# Patient Record
Sex: Male | Born: 2000 | Race: Black or African American | Hispanic: No | Marital: Single
Health system: Southern US, Community
[De-identification: ages and names within clinical notes are randomized; demographics above are authoritative.]

## PROBLEM LIST (undated history)

## (undated) HISTORY — PX: NO PAST SURGERIES: SHX2092

---
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
## 2019-05-09 ENCOUNTER — Other Ambulatory Visit: Payer: Self-pay

## 2019-05-09 ENCOUNTER — Ambulatory Visit: Payer: Self-pay | Attending: Internal Medicine

## 2019-05-09 DIAGNOSIS — Z20822 Contact with and (suspected) exposure to covid-19: Secondary | ICD-10-CM

## 2019-05-10 LAB — NOVEL CORONAVIRUS, NAA: SARS-CoV-2, NAA: NOT DETECTED

## 2020-03-06 ENCOUNTER — Encounter (INDEPENDENT_AMBULATORY_CARE_PROVIDER_SITE_OTHER): Payer: Self-pay | Admitting: Student in an Organized Health Care Education/Training Program

## 2021-09-05 ENCOUNTER — Other Ambulatory Visit: Payer: Self-pay

## 2021-09-05 ENCOUNTER — Emergency Department (HOSPITAL_COMMUNITY)
Admission: EM | Admit: 2021-09-05 | Discharge: 2021-09-05 | Disposition: A | Discharge status: Home / Self Care | Payer: Self-pay | Attending: Emergency Medicine | Admitting: Emergency Medicine

## 2021-09-05 ENCOUNTER — Encounter (HOSPITAL_COMMUNITY): Payer: Self-pay

## 2021-09-05 DIAGNOSIS — Z23 Encounter for immunization: Secondary | ICD-10-CM | POA: Insufficient documentation

## 2021-09-05 DIAGNOSIS — S71111A Laceration without foreign body, right thigh, initial encounter: Secondary | ICD-10-CM | POA: Insufficient documentation

## 2021-09-05 DIAGNOSIS — J029 Acute pharyngitis, unspecified: Secondary | ICD-10-CM | POA: Insufficient documentation

## 2021-09-05 DIAGNOSIS — Z20822 Contact with and (suspected) exposure to covid-19: Secondary | ICD-10-CM | POA: Insufficient documentation

## 2021-09-05 DIAGNOSIS — W268XXA Contact with other sharp object(s), not elsewhere classified, initial encounter: Secondary | ICD-10-CM | POA: Insufficient documentation

## 2021-09-05 DIAGNOSIS — S81811A Laceration without foreign body, right lower leg, initial encounter: Secondary | ICD-10-CM

## 2021-09-05 LAB — RESP PANEL BY RT-PCR (FLU A&B, COVID) ARPGX2
Influenza A by PCR: NEGATIVE
Influenza B by PCR: NEGATIVE
SARS Coronavirus 2 by RT PCR: NEGATIVE

## 2021-09-05 LAB — GROUP A STREP BY PCR: Group A Strep by PCR: NOT DETECTED

## 2021-09-05 MED ORDER — TETANUS-DIPHTH-ACELL PERTUSSIS 5-2.5-18.5 LF-MCG/0.5 IM SUSY
0.5000 mL | PREFILLED_SYRINGE | Freq: Once | INTRAMUSCULAR | Status: AC
  Administered 2021-09-05: 0.5 mL via INTRAMUSCULAR
  Filled 2021-09-05: qty 0.5

## 2021-09-05 MED ORDER — LIDOCAINE-EPINEPHRINE (PF) 2 %-1:200000 IJ SOLN
10.0000 mL | Freq: Once | INTRAMUSCULAR | Status: AC
  Administered 2021-09-05: 10 mL
  Filled 2021-09-05: qty 20

## 2021-09-05 NOTE — ED Notes (Signed)
An After Visit Summary was printed and given to the patient. Discharge instructions given and no further questions at this time.  
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
## 2021-09-05 NOTE — Discharge Instructions (Addendum)
You were seen today for a laceration. The sutures should be removed in 8-10 days. Please keep the area clean.  I recommend using over the counter medication for your sore throat. Return to work once you are fever free for 24 hours
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
## 2022-02-06 ENCOUNTER — Other Ambulatory Visit: Payer: Self-pay

## 2022-02-06 ENCOUNTER — Encounter (HOSPITAL_COMMUNITY): Payer: Self-pay | Admitting: Emergency Medicine

## 2022-02-06 ENCOUNTER — Ambulatory Visit
Admission: RE | Admit: 2022-02-06 | Discharge: 2022-02-06 | Disposition: A | Discharge status: Home / Self Care | Payer: Commercial Managed Care - HMO | Source: Ambulatory Visit | Attending: Emergency Medicine | Admitting: Emergency Medicine

## 2022-02-06 ENCOUNTER — Emergency Department (HOSPITAL_COMMUNITY)
Admission: EM | Admit: 2022-02-06 | Discharge: 2022-02-06 | Disposition: A | Discharge status: Home / Self Care | Payer: Commercial Managed Care - HMO | Attending: Emergency Medicine | Admitting: Emergency Medicine

## 2022-02-06 DIAGNOSIS — J069 Acute upper respiratory infection, unspecified: Secondary | ICD-10-CM | POA: Insufficient documentation

## 2022-02-06 DIAGNOSIS — Z20822 Contact with and (suspected) exposure to covid-19: Secondary | ICD-10-CM | POA: Insufficient documentation

## 2022-02-06 DIAGNOSIS — B349 Viral infection, unspecified: Secondary | ICD-10-CM

## 2022-02-06 DIAGNOSIS — J029 Acute pharyngitis, unspecified: Secondary | ICD-10-CM | POA: Diagnosis present

## 2022-02-06 LAB — RESP PANEL BY RT-PCR (FLU A&B, COVID) ARPGX2
Influenza A by PCR: NEGATIVE
Influenza B by PCR: NEGATIVE
SARS Coronavirus 2 by RT PCR: NEGATIVE

## 2022-02-06 LAB — GROUP A STREP BY PCR: Group A Strep by PCR: NOT DETECTED

## 2022-02-06 NOTE — Discharge Instructions (Addendum)
Your COVID, flu and strep tests are all negative today.  Good medications for cold and flu symptoms include DayQuil and NyQuil.  These are similar medications to Tylenoll Cold and Flu.  Either one is fine.  Ibuprofen is very good for body aches and chills as well.  It is safe to alternate this with the above medications.  You may try anything else over-the-counter that helps you.  Ultimately, be sure to stay hydrated and return to the emergency department with any worsening symptoms.  It was a pleasure to meet you and we hope you feel better.  Your work note is attached!

## 2022-02-06 NOTE — ED Triage Notes (Signed)
Pt c/o sore throat, congestion, cough, and headache x 2 days.

## 2022-02-06 NOTE — ED Provider Notes (Signed)
[facility]-EMERGENCY DEPT Provider Note   CSN: [id number] Arrival date & time: 02/06/22  2118     History  Chief Complaint  Patient presents with   Sore Throat    Craig Costa is a 21 y.o. male presenting with 2 days worth of URI symptoms.  Reports he started out with nasal congestion however after taking Mucinex he has profuse rhinorrhea.  Also complaining of headache, sore throat cough.  Believes someone at work is sick.   Sore Throat       Home Medications Prior to Admission medications   Medication Sig Start Date End Date Taking? Authorizing Provider  amitriptyline (ELAVIL) 25 MG tablet Take 1.5 tablets (37.5 mg total) by mouth at bedtime. (Start with 1 tablet nightly for the first week) 08/05/17   Keturah Shavers, MD  dicyclomine (BENTYL) 20 MG tablet Take 1 tablet (20 mg total) by mouth 2 (two) times daily. Patient not taking: Reported on 08/05/2017 01/05/17   Rolan Bucco, MD  hyoscyamine (LEVSIN, ANASPAZ) 0.125 MG tablet Take 1 tablet (0.125 mg total) by mouth every 4 (four) hours as needed. Patient not taking: Reported on 08/05/2017 01/19/17   Adelene Amas, MD  lansoprazole (PREVACID) 30 MG capsule Take 30 mg by mouth daily at 12 noon.    [provider]  magnesium oxide (MAG-OX) 400 MG tablet Take 400 mg by mouth daily.    [provider]  RaNITidine HCl (ZANTAC PO) Take by mouth.    [provider]  Riboflavin 100 MG TABS Take by mouth.    [provider]      Allergies    Amoxicillin    Review of Systems   Review of Systems  Physical Exam Updated Vital Signs BP (!) 163/84 (BP Location: Left Arm)   Pulse 68   Temp 98.2 F (36.8 C) (Oral)   Resp 16   Ht 5\' 11"  (1.803 m)   Wt 63.5 kg   SpO2 100%   BMI 19.53 kg/m  Physical Exam Vitals and nursing note reviewed.  Constitutional:      General: He is not in acute distress.    Appearance: Normal appearance. He is not ill-appearing.  HENT:      Head: Normocephalic and atraumatic.     Mouth/Throat:     Mouth: Mucous membranes are moist.     Pharynx: Oropharynx is clear. Posterior oropharyngeal erythema present. No oropharyngeal exudate.     Tonsils: No tonsillar exudate.  Eyes:     General: No scleral icterus.    Conjunctiva/sclera: Conjunctivae normal.  Pulmonary:     Effort: Pulmonary effort is normal. No respiratory distress.  Skin:    Findings: No rash.  Neurological:     Mental Status: He is alert.  Psychiatric:        Mood and Affect: Mood normal.     ED Results / Procedures / Treatments   Labs (all labs ordered are listed, but only abnormal results are displayed) Labs Reviewed  RESP PANEL BY RT-PCR (FLU A&B, COVID) ARPGX2  GROUP A STREP BY PCR    EKG None  Radiology No results found.  Procedures Procedures   Medications Ordered in ED Medications - No data to display  ED Course/ Medical Decision Making/ A&P                           Medical Decision Making  21 year old male presenting with viral symptoms.  Have been going  on for the past couple days.  Questionable sick contact at work.  Work-up today negative.  No strep, COVID, flu.  Well-appearing and has not tried any other over-the-counter medications outside of Mucinex.  He was given a list of appropriate medications to try and a work note.  He is agreeable to discharge at this time.  Does not appear to have an emergent condition requiring further imaging or intervention.  Ambulated out of the department.   Final Clinical Impression(s) / ED Diagnoses Final diagnoses:  Viral URI with cough    Rx / DC Orders ED Discharge Orders     None      Results and diagnoses were explained to the patient. Return precautions discussed in full. Patient had no additional questions and expressed complete understanding.   This chart was dictated using voice recognition software.  Despite best efforts to proofread,  errors can occur which can change the  documentation meaning.     Woodroe Chen 02/06/22 2323    Lorre Nick, MD 02/10/22 1431

## 2022-02-06 NOTE — ED Notes (Signed)
Attempt to call patient from lobby, no response.  

## 2022-02-06 NOTE — ED Notes (Signed)
Attempt to call from lobby, no response. Patient called and states he is on the way in.

## 2022-02-06 NOTE — ED Notes (Signed)
Attempt to call from lobby, patient in restroom.

## 2022-02-07 ENCOUNTER — Ambulatory Visit (HOSPITAL_COMMUNITY): Payer: Self-pay

## 2022-03-25 ENCOUNTER — Ambulatory Visit
Admission: EM | Admit: 2022-03-25 | Discharge: 2022-03-25 | Disposition: A | Discharge status: Home / Self Care | Payer: BC Managed Care – PPO | Attending: Emergency Medicine | Admitting: Emergency Medicine

## 2022-03-25 DIAGNOSIS — Z20822 Contact with and (suspected) exposure to covid-19: Secondary | ICD-10-CM | POA: Insufficient documentation

## 2022-03-25 DIAGNOSIS — J309 Allergic rhinitis, unspecified: Secondary | ICD-10-CM | POA: Insufficient documentation

## 2022-03-25 NOTE — ED Provider Notes (Signed)
[facility]    CSN: [id number] Arrival date & time: 03/25/22  1118    HISTORY   Chief Complaint  Patient presents with   Cough   HPI Craig Costa is a pleasant, 21 y.o. male who presents to urgent care today. Pt complains of a 4-day history of clear runny nose, nasal congestion, nonproductive cough.  Patient states his employer sent him home early today because of his coughing, states he needs a note to return to work. Pt tried Benadryl with no relief.  Patient reports a history of allergy to pollen, not currently taking allergy medication at this time.  Patient states the cough sometimes comes out of nowhere and once he starts coughing it is hard to stop.  Patient states he has had a runny nose but has also noticed that he has a lot of postnasal drip as well.  Patient states sometimes his cough wakes him up at night.  Patient denies fever, body aches, chills, sore throat, loss of taste or smell, nausea, vomiting, diarrhea.  Patient states he has been exposed to several coworkers with COVID-19.  The history is provided by the patient.   History reviewed. No pertinent past medical history. Patient Active Problem List   Diagnosis Date Noted   RHINITIS, ALLERGIC 05/28/2006   ECZEMA, ATOPIC DERMATITIS 05/28/2006   Past Surgical History:  Procedure Laterality Date   NO PAST SURGERIES      Home Medications    Prior to Admission medications   Medication Sig Start Date End Date Taking? Authorizing Provider  amitriptyline (ELAVIL) 25 MG tablet Take 1.5 tablets (37.5 mg total) by mouth at bedtime. (Start with 1 tablet nightly for the first week) 08/05/17   Keturah Shavers, MD  lansoprazole (PREVACID) 30 MG capsule Take 30 mg by mouth daily at 12 noon.    [provider]  magnesium oxide (MAG-OX) 400 MG tablet Take 400 mg by mouth daily.    [provider]  RaNITidine HCl (ZANTAC PO) Take by mouth.    [provider]  Riboflavin 100 MG TABS Take by  mouth.    [provider]    Family History Family History  Problem Relation Age of Onset   Crohn's disease Mother    Crohn's disease Paternal Grandfather    Seizures Sister    Autism Brother    ADD / ADHD Brother    Migraines Neg Hx    Anxiety disorder Neg Hx    Depression Neg Hx    Bipolar disorder Neg Hx    Schizophrenia Neg Hx    Social History Social History   Tobacco Use   Smoking status: Passive Smoke Exposure - Never Smoker   Smokeless tobacco: Never  Substance Use Topics   Alcohol use: No   Drug use: No   Allergies   Amoxicillin  Review of Systems Review of Systems Pertinent findings revealed after performing a 14 point review of systems has been noted in the history of present illness.  Physical Exam Triage Vital Signs ED Triage Vitals  Enc Vitals Group     BP 01/25/21 0827 (!) 147/82     Pulse Rate 01/25/21 0827 72     Resp 01/25/21 0827 18     Temp 01/25/21 0827 98.3 F (36.8 C)     Temp Source 01/25/21 0827 Oral     SpO2 01/25/21 0827 98 %     Weight --      Height --  Head Circumference --      Peak Flow --      Pain Score 01/25/21 0826 5     Pain Loc --      Pain Edu? --      Excl. in GC? --   No data found.  Updated Vital Signs BP 127/73 (BP Location: Left Arm)   Pulse 69   Temp 98.5 F (36.9 C) (Oral)   Resp 18   SpO2 98%   Physical Exam Vitals and nursing note reviewed.  Constitutional:      General: He is not in acute distress.    Appearance: Normal appearance. He is not ill-appearing.  HENT:     Head: Normocephalic and atraumatic.     Salivary Glands: Right salivary gland is not diffusely enlarged or tender. Left salivary gland is not diffusely enlarged or tender.     Right Ear: Ear canal and external ear normal. No drainage. A middle ear effusion is present. There is no impacted cerumen. Tympanic membrane is bulging. Tympanic membrane is not injected or erythematous.     Left Ear: Ear canal and external ear  normal. No drainage. A middle ear effusion is present. There is no impacted cerumen. Tympanic membrane is bulging. Tympanic membrane is not injected or erythematous.     Ears:     Comments: Bilateral EACs normal, both TMs bulging with clear fluid    Nose: Rhinorrhea present. No nasal deformity, septal deviation, signs of injury, nasal tenderness, mucosal edema or congestion. Rhinorrhea is clear.     Right Nostril: Occlusion present. No foreign body, epistaxis or septal hematoma.     Left Nostril: Occlusion present. No foreign body, epistaxis or septal hematoma.     Right Turbinates: Enlarged, swollen and pale.     Left Turbinates: Enlarged, swollen and pale.     Right Sinus: No maxillary sinus tenderness or frontal sinus tenderness.     Left Sinus: No maxillary sinus tenderness or frontal sinus tenderness.     Mouth/Throat:     Lips: Pink. No lesions.     Mouth: Mucous membranes are moist. No oral lesions.     Pharynx: Oropharynx is clear. Uvula midline. No posterior oropharyngeal erythema or uvula swelling.     Tonsils: No tonsillar exudate. 0 on the right. 0 on the left.     Comments: Postnasal drip Eyes:     General: Lids are normal.        Right eye: No discharge.        Left eye: No discharge.     Extraocular Movements: Extraocular movements intact.     Conjunctiva/sclera: Conjunctivae normal.     Right eye: Right conjunctiva is not injected.     Left eye: Left conjunctiva is not injected.  Neck:     Trachea: Trachea and phonation normal.  Cardiovascular:     Rate and Rhythm: Normal rate and regular rhythm.     Pulses: Normal pulses.     Heart sounds: Normal heart sounds. No murmur heard.    No friction rub. No gallop.  Pulmonary:     Effort: Pulmonary effort is normal. No accessory muscle usage, prolonged expiration or respiratory distress.     Breath sounds: Normal breath sounds. No stridor, decreased air movement or transmitted upper airway sounds. No decreased breath sounds,  wheezing, rhonchi or rales.  Chest:     Chest wall: No tenderness.  Musculoskeletal:        General: Normal range of motion.  Cervical back: Normal range of motion and neck supple. Normal range of motion.  Lymphadenopathy:     Cervical: No cervical adenopathy.  Skin:    General: Skin is warm and dry.     Findings: No erythema or rash.  Neurological:     General: No focal deficit present.     Mental Status: He is alert and oriented to person, place, and time.  Psychiatric:        Mood and Affect: Mood normal.        Behavior: Behavior normal.     Visual Acuity Right Eye Distance:   Left Eye Distance:   Bilateral Distance:    Right Eye Near:   Left Eye Near:    Bilateral Near:     UC Couse / Diagnostics / Procedures:     Radiology No results found.  Procedures Procedures (including critical care time) EKG  Pending results:  Labs Reviewed  SARS CORONAVIRUS 2 (TAT 6-24 HRS)    Medications Ordered in UC: Medications - No data to display  UC Diagnoses / Final Clinical Impressions(s)   I have reviewed the triage vital signs and the nursing notes.  Pertinent labs & imaging results that were available during my care of the patient were reviewed by me and considered in my medical decision making (see chart for details).    Final diagnoses:  Exposure to COVID-19 virus  Allergic rhinitis, unspecified seasonality, unspecified trigger   COVID-19 test pending.  If patient has a positive COVID-19 test, recommend 5-day course of dexamethasone 6 mg twice daily. Patient provided with prescriptions for Zyrtec and Flonase which were sent after discharge.  Conservative care recommended.  Return precautions advised.  Note provided for work. Please see discharge instructions below for further details of plan of care as provided to patient. ED Prescriptions   None    PDMP not reviewed this encounter.  Disposition Upon Discharge:  Condition: stable for discharge home Home:  take medications as prescribed; routine discharge instructions as discussed; follow up as advised.  Patient presented with an acute illness with associated systemic symptoms and significant discomfort requiring urgent management. In my opinion, this is a condition that a prudent lay person (someone who possesses an average knowledge of health and medicine) may potentially expect to result in complications if not addressed urgently such as respiratory distress, impairment of bodily function or dysfunction of bodily organs.   Routine symptom specific, illness specific and/or disease specific instructions were discussed with the patient and/or caregiver at length.   As such, the patient has been evaluated and assessed, work-up was performed and treatment was provided in alignment with urgent care protocols and evidence based medicine.  Patient/parent/caregiver has been advised that the patient may require follow up for further testing and treatment if the symptoms continue in spite of treatment, as clinically indicated and appropriate.  If the patient was tested for COVID-19, Influenza and/or RSV, then the patient/parent/guardian was advised to isolate at home pending the results of his/her diagnostic coronavirus test and potentially longer if they're positive. I have also advised pt that if his/her COVID-19 test returns positive, it's recommended to self-isolate for at least 10 days after symptoms first appeared AND until fever-free for 24 hours without fever reducer AND other symptoms have improved or resolved. Discussed self-isolation recommendations as well as instructions for household member/close contacts as per the [facility] and [place] [facility]HS, and also gave patient the COVID packet with this information.  Patient/parent/caregiver has been advised to return  to the [facility] or PCP in 3-5 days if no better; to PCP or the Emergency Department if new signs and symptoms develop, or if the current signs or symptoms continue  to change or worsen for further workup, evaluation and treatment as clinically indicated and appropriate  The patient will follow up with their current PCP if and as advised. If the patient does not currently have a PCP we will assist them in obtaining one.   The patient may need specialty follow up if the symptoms continue, in spite of conservative treatment and management, for further workup, evaluation, consultation and treatment as clinically indicated and appropriate.  Patient/parent/caregiver verbalized understanding and agreement of plan as discussed.  All questions were addressed during visit.  Please see discharge instructions below for further details of plan.  Discharge Instructions:   Discharge Instructions      You received a COVID-19 PCR test today.  The result of your COVID-19 test will be posted to your MyChart once it is complete, typically this takes 24 to 36 hours.  I provided you with a note to return to work which will require you to.  Resolved.     If your COVID-19 PCR test is positive, you will be contacted by phone.  Because you do not have a history of being immune compromised, you are currently vaccinated for COVID-19, you are under the age of 23, and/or you do not have a risk of severe disease due to COVID-19, antiviral treatment is not indicated.  I do however recommend that you begin taking dexamethasone 6 mg twice daily for 5 days if your COVID-19 test is positive.  Isolation is recommended for 3 days in patients who have COVID-19, because you have already been sick for 4 days, isolation will no longer be needed.     If your COVID-19 PCR test is negative, please consider retesting in the next 2 to 3 days, particularly if you are not feeling any better.  You are welcome to return here to urgent care to have it done or you can take a home COVID-19 test.   If both your COVID-19 tests are negative, then you can safely assume that your illness is due to one of the many less  serious illnesses circulating in our community right now.       Based on my physical exam findings and the history you have provided  today, I do not recommend antibiotics at this time.  I do not believe the risks and side effects of antibiotics would outweigh any minimal benefit that they might provide.       Your symptoms and my physical exam findings are actually more concerning for exacerbation of your underlying allergies.  You may be allergic to more than pollen at this point in your life.   Please see the list below for recommended medications, dosages and frequencies to provide relief of current symptoms:     Zyrtec (cetirizine): This is an excellent second-generation antihistamine that helps to reduce respiratory inflammatory response to environmental allergens.  In some patients, this medication can cause daytime sleepiness so I recommend that you take 1 tablet daily at bedtime.     Flonase (fluticasone): This is a steroid nasal spray that you use once daily, 1 spray in each nare.  This medication does not work well if you decide to use it only used as you feel you need to, it works best used on a daily basis.  After 3 to 5 days of  use, you will notice significant reduction of the inflammation and mucus production that is currently being caused by exposure to allergens, whether seasonal or environmental.  The most common side effect of this medication is nosebleeds.  If you experience a nosebleed, please discontinue use for 1 week, then feel free to resume.  I have provided you with a prescription.     If you find that you have not had improvement of your symptoms in the next 5 to 7 days, please follow-up with your primary care provider or return here to urgent care for repeat evaluation and further recommendations.   Thank you for visiting urgent care today.  We appreciate the opportunity to participate in your care.       This office note has been dictated using Engineer, structural.  Unfortunately, this method of dictation can sometimes lead to typographical or grammatical errors.  I apologize for your inconvenience in advance if this occurs.  Please do not hesitate to reach out to me if clarification is needed.      Theadora Rama Scales, PA-C 03/27/22 1423

## 2022-03-25 NOTE — ED Triage Notes (Signed)
Pt present coughing with congestion and nasal drainage. Symptoms started two days ago. Pt tried otc medication with no relief.

## 2022-03-25 NOTE — Discharge Instructions (Signed)
You received a COVID-19 PCR test today.  The result of your COVID-19 test will be posted to your MyChart once it is complete, typically this takes 24 to 36 hours.  I provided you with a note to return to work which will require you to.  Resolved.     If your COVID-19 PCR test is positive, you will be contacted by phone.  Because you do not have a history of being immune compromised, you are currently vaccinated for COVID-19, you are under the age of 53, and/or you do not have a risk of severe disease due to COVID-19, antiviral treatment is not indicated.  I do however recommend that you begin taking dexamethasone 6 mg twice daily for 5 days if your COVID-19 test is positive.  Isolation is recommended for 3 days in patients who have COVID-19, because you have already been sick for 4 days, isolation will no longer be needed.     If your COVID-19 PCR test is negative, please consider retesting in the next 2 to 3 days, particularly if you are not feeling any better.  You are welcome to return here to urgent care to have it done or you can take a home COVID-19 test.   If both your COVID-19 tests are negative, then you can safely assume that your illness is due to one of the many less serious illnesses circulating in our community right now.       Based on my physical exam findings and the history you have provided  today, I do not recommend antibiotics at this time.  I do not believe the risks and side effects of antibiotics would outweigh any minimal benefit that they might provide.       Your symptoms and my physical exam findings are actually more concerning for exacerbation of your underlying allergies.  You may be allergic to more than pollen at this point in your life.   Please see the list below for recommended medications, dosages and frequencies to provide relief of current symptoms:     Zyrtec (cetirizine): This is an excellent second-generation antihistamine that helps to reduce respiratory  inflammatory response to environmental allergens.  In some patients, this medication can cause daytime sleepiness so I recommend that you take 1 tablet daily at bedtime.     Flonase (fluticasone): This is a steroid nasal spray that you use once daily, 1 spray in each nare.  This medication does not work well if you decide to use it only used as you feel you need to, it works best used on a daily basis.  After 3 to 5 days of use, you will notice significant reduction of the inflammation and mucus production that is currently being caused by exposure to allergens, whether seasonal or environmental.  The most common side effect of this medication is nosebleeds.  If you experience a nosebleed, please discontinue use for 1 week, then feel free to resume.  I have provided you with a prescription.     If you find that you have not had improvement of your symptoms in the next 5 to 7 days, please follow-up with your primary care provider or return here to urgent care for repeat evaluation and further recommendations.   Thank you for visiting urgent care today.  We appreciate the opportunity to participate in your care.

## 2022-03-26 LAB — SARS CORONAVIRUS 2 (TAT 6-24 HRS): SARS Coronavirus 2: NEGATIVE

## 2022-03-27 ENCOUNTER — Telehealth: Payer: Self-pay | Admitting: Emergency Medicine

## 2022-03-27 MED ORDER — CETIRIZINE HCL 10 MG PO TABS: 10.0000 mg | ORAL_TABLET | Freq: Every day | ORAL | 1 refills | Status: AC

## 2022-03-27 MED ORDER — FLUTICASONE PROPIONATE 50 MCG/ACT NA SUSP: 1.0000 | Freq: Every day | NASAL | 2 refills | Status: AC

## 2022-03-27 NOTE — Telephone Encounter (Signed)
Prescriptions were not sent at time of visit.

## 2022-06-23 ENCOUNTER — Ambulatory Visit
Admission: RE | Admit: 2022-06-23 | Discharge: 2022-06-23 | Disposition: A | Discharge status: Home / Self Care | Payer: Commercial Managed Care - HMO | Source: Ambulatory Visit | Attending: Urgent Care | Admitting: Urgent Care

## 2022-06-23 VITALS — BP 138/74 | HR 66 | Temp 98.6°F | Resp 16

## 2022-06-23 DIAGNOSIS — H109 Unspecified conjunctivitis: Secondary | ICD-10-CM

## 2022-06-23 MED ORDER — TOBRAMYCIN 0.3 % OP SOLN: 1.0000 [drp] | OPHTHALMIC | 0 refills | Status: AC

## 2022-06-23 NOTE — ED Triage Notes (Signed)
Patient presents to UC for possible pink eye. He states he noted redness to right eye this morning. Not taking any OTC meds.

## 2022-06-23 NOTE — ED Provider Notes (Signed)
[facility]  Note:  This document was prepared using Systems analyst and may include unintentional dictation errors.  MRN: [id number] DOB: 10-14-2000  Subjective:   Craig Costa is a 22 y.o. male presenting for 1 day history of acute onset right eye irritation, redness, matting of the eyelashes.  No vision changes, eye trauma, contact lens use.  No current facility-administered medications for this encounter.  Current Outpatient Medications:    amitriptyline (ELAVIL) 25 MG tablet, Take 1.5 tablets (37.5 mg total) by mouth at bedtime. (Start with 1 tablet nightly for the first week), Disp: 45 tablet, Rfl: 3   cetirizine (ZYRTEC ALLERGY) 10 MG tablet, Take 1 tablet (10 mg total) by mouth at bedtime., Disp: 90 tablet, Rfl: 1   fluticasone (FLONASE) 50 MCG/ACT nasal spray, Place 1 spray into both nostrils daily. Begin by using 2 sprays in each nare daily for 3 to 5 days, then decrease to 1 spray in each nare daily., Disp: 15.8 mL, Rfl: 2   lansoprazole (PREVACID) 30 MG capsule, Take 30 mg by mouth daily at 12 noon., Disp: , Rfl:    magnesium oxide (MAG-OX) 400 MG tablet, Take 400 mg by mouth daily., Disp: , Rfl:    RaNITidine HCl (ZANTAC PO), Take by mouth., Disp: , Rfl:    Riboflavin 100 MG TABS, Take by mouth., Disp: , Rfl:    Allergies  Allergen Reactions   Amoxicillin     hives    History reviewed. No pertinent past medical history.   Past Surgical History:  Procedure Laterality Date   NO PAST SURGERIES      Family History  Problem Relation Age of Onset   Crohn's disease Mother    Crohn's disease Paternal Grandfather    Seizures Sister    Autism Brother    ADD / ADHD Brother    Migraines Neg Hx    Anxiety disorder Neg Hx    Depression Neg Hx    Bipolar disorder Neg Hx    Schizophrenia Neg Hx     Social History   Tobacco Use   Smoking status: Passive Smoke Exposure - Never Smoker   Smokeless tobacco: Never  Substance  Use Topics   Alcohol use: No   Drug use: No    ROS   Objective:   Vitals: BP 138/74 (BP Location: Right Arm)   Pulse 66   Temp 98.6 F (37 C) (Oral)   Resp 16   SpO2 98%   Physical Exam Constitutional:      General: He is not in acute distress.    Appearance: Normal appearance. He is well-developed and normal weight. He is not ill-appearing, toxic-appearing or diaphoretic.  HENT:     Head: Normocephalic and atraumatic.     Right Ear: External ear normal.     Left Ear: External ear normal.     Nose: Nose normal.     Mouth/Throat:     Pharynx: Oropharynx is clear.  Eyes:     General: Lids are everted, no foreign bodies appreciated. No scleral icterus.       Right eye: No foreign body, discharge or hordeolum.        Left eye: No foreign body, discharge or hordeolum.     Extraocular Movements: Extraocular movements intact.     Conjunctiva/sclera:     Right eye: Right conjunctiva is injected. No chemosis, exudate or hemorrhage.    Left eye: Left conjunctiva is not injected. No chemosis, exudate  or hemorrhage. Cardiovascular:     Rate and Rhythm: Normal rate.  Pulmonary:     Effort: Pulmonary effort is normal.  Musculoskeletal:     Cervical back: Normal range of motion.  Neurological:     Mental Status: He is alert and oriented to person, place, and time.  Psychiatric:        Mood and Affect: Mood normal.        Behavior: Behavior normal.        Thought Content: Thought content normal.        Judgment: Judgment normal.     Assessment and Plan :   PDMP not reviewed this encounter.  1. Bacterial conjunctivitis of right eye     Will start tobramycin to address bacterial conjunctivitis of the right eye. Counseled patient on potential for adverse effects with medications prescribed/recommended today, ER and return-to-clinic precautions discussed, patient verbalized understanding.    Jaynee Eagles, PA-C 06/23/22 1139

## 2022-07-27 ENCOUNTER — Encounter (HOSPITAL_COMMUNITY): Payer: Self-pay

## 2022-07-27 ENCOUNTER — Emergency Department (HOSPITAL_COMMUNITY)
Admission: EM | Admit: 2022-07-27 | Discharge: 2022-07-27 | Disposition: A | Discharge status: Home / Self Care | Payer: BC Managed Care – PPO | Attending: Emergency Medicine | Admitting: Emergency Medicine

## 2022-07-27 DIAGNOSIS — R1110 Vomiting, unspecified: Secondary | ICD-10-CM | POA: Diagnosis present

## 2022-07-27 DIAGNOSIS — R112 Nausea with vomiting, unspecified: Secondary | ICD-10-CM

## 2022-07-27 DIAGNOSIS — K529 Noninfective gastroenteritis and colitis, unspecified: Secondary | ICD-10-CM | POA: Diagnosis not present

## 2022-07-27 LAB — CBC
HCT: 42.7 % (ref 39.0–52.0)
Hemoglobin: 15.5 g/dL (ref 13.0–17.0)
MCH: 30.9 pg (ref 26.0–34.0)
MCHC: 36.3 g/dL — ABNORMAL HIGH (ref 30.0–36.0)
MCV: 85.2 fL (ref 80.0–100.0)
Platelets: 234 10*3/uL (ref 150–400)
RBC: 5.01 MIL/uL (ref 4.22–5.81)
RDW: 12 % (ref 11.5–15.5)
WBC: 7.1 10*3/uL (ref 4.0–10.5)
nRBC: 0 % (ref 0.0–0.2)

## 2022-07-27 LAB — BASIC METABOLIC PANEL
Anion gap: 14 (ref 5–15)
BUN: 18 mg/dL (ref 6–20)
CO2: 23 mmol/L (ref 22–32)
Calcium: 9.6 mg/dL (ref 8.9–10.3)
Chloride: 102 mmol/L (ref 98–111)
Creatinine, Ser: 0.82 mg/dL (ref 0.61–1.24)
GFR, Estimated: >60 mL/min (ref >60)
Glucose, Bld: 90 mg/dL (ref 70–99)
Potassium: 3.7 mmol/L (ref 3.5–5.1)
Sodium: 139 mmol/L (ref 135–145)

## 2022-07-27 MED ORDER — LACTATED RINGERS IV BOLUS
1000.0000 mL | Freq: Once | INTRAVENOUS | Status: AC
  Administered 2022-07-27: 1000 mL via INTRAVENOUS

## 2022-07-27 MED ORDER — ONDANSETRON HCL 4 MG/2ML IJ SOLN
4.0000 mg | Freq: Once | INTRAMUSCULAR | Status: AC
  Administered 2022-07-27: 4 mg via INTRAVENOUS
  Filled 2022-07-27: qty 2

## 2022-07-27 MED ORDER — ONDANSETRON HCL 4 MG PO TABS: 4.0000 mg | ORAL_TABLET | Freq: Four times a day (QID) | ORAL | 0 refills | Status: AC

## 2022-07-27 NOTE — ED Triage Notes (Signed)
Pt has had emesis since 10am today, approx 9 times. Pt states he ate some old food this morning. Pt has had 1 episode of diarrhea PTA.

## 2022-07-27 NOTE — ED Provider Notes (Signed)
[place] [facility] AT [facility] Provider Note   CSN: [id number] Arrival date & time: 07/27/22  1332     History  Chief Complaint  Patient presents with   Emesis    Craig Costa is a 22 y.o. male with no significant past medical history presents the emergency department complaining of vomiting starting this morning.  Believes that he ate some food that had gone bad.  Had 1 episode of diarrhea.  No abdominal pain or fever.  Believes he vomited about 9 times, reports nonbloody nonbilious.   Emesis Associated symptoms: diarrhea        Home Medications Prior to Admission medications   Medication Sig Start Date End Date Taking? Authorizing Provider  ondansetron (ZOFRAN) 4 MG tablet Take 1 tablet (4 mg total) by mouth every 6 (six) hours. 07/27/22  Yes Lam Bjorklund T, PA-C  amitriptyline (ELAVIL) 25 MG tablet Take 1.5 tablets (37.5 mg total) by mouth at bedtime. (Start with 1 tablet nightly for the first week) 08/05/17   Keturah Shavers, MD  cetirizine (ZYRTEC ALLERGY) 10 MG tablet Take 1 tablet (10 mg total) by mouth at bedtime. 03/27/22 09/23/22  Theadora Rama Scales, PA-C  fluticasone (FLONASE) 50 MCG/ACT nasal spray Place 1 spray into both nostrils daily. Begin by using 2 sprays in each nare daily for 3 to 5 days, then decrease to 1 spray in each nare daily. 03/27/22   Theadora Rama Scales, PA-C  lansoprazole (PREVACID) 30 MG capsule Take 30 mg by mouth daily at 12 noon.    [provider]  magnesium oxide (MAG-OX) 400 MG tablet Take 400 mg by mouth daily.    [provider]  RaNITidine HCl (ZANTAC PO) Take by mouth.    [provider]  Riboflavin 100 MG TABS Take by mouth.    [provider]  tobramycin (TOBREX) 0.3 % ophthalmic solution Place 1 drop into the right eye every 4 (four) hours. 06/23/22   Wallis Bamberg, PA-C      Allergies    Amoxicillin    Review of Systems   Review of Systems  Gastrointestinal:   Positive for diarrhea, nausea and vomiting.  All other systems reviewed and are negative.   Physical Exam Updated Vital Signs BP 139/68 (BP Location: Left Arm)   Pulse (!) 57   Temp 98.7 F (37.1 C) (Oral)   Resp 18   SpO2 94%  Physical Exam Vitals and nursing note reviewed.  Constitutional:      Appearance: Normal appearance.  HENT:     Head: Normocephalic and atraumatic.  Eyes:     Conjunctiva/sclera: Conjunctivae normal.  Cardiovascular:     Rate and Rhythm: Normal rate and regular rhythm.  Pulmonary:     Effort: Pulmonary effort is normal. No respiratory distress.     Breath sounds: Normal breath sounds.  Abdominal:     General: There is no distension.     Palpations: Abdomen is soft.     Tenderness: There is no abdominal tenderness.  Skin:    General: Skin is warm and dry.  Neurological:     General: No focal deficit present.     Mental Status: He is alert.     ED Results / Procedures / Treatments   Labs (all labs ordered are listed, but only abnormal results are displayed) Labs Reviewed  CBC - Abnormal; Notable for the following components:      Result Value   MCHC 36.3 (*)    All  other components within normal limits  BASIC METABOLIC PANEL    EKG None  Radiology No results found.  Procedures Procedures    Medications Ordered in ED Medications  lactated ringers bolus 1,000 mL (0 mLs Intravenous Stopped 07/27/22 1645)  ondansetron (ZOFRAN) injection 4 mg (4 mg Intravenous Given 07/27/22 1437)    ED Course/ Medical Decision Making/ A&P                             Medical Decision Making Amount and/or Complexity of Data Reviewed Labs: ordered.  Risk Prescription drug management.  This patient is a 22 y.o. male  who presents to the ED for concern of vomiting.   Differential diagnoses prior to evaluation: The emergent differential diagnosis includes, but is not limited to, Boerhaave's, DKA, elevated ICP, Ischemic bowel, Sepsis, Drug-related  (toxicity, THC hyperemesis, ETOH, withdrawal), Appendicitis, Bowel obstruction, Electrolyte abnormalities, Pancreatitis, Biliary colic, Gastroenteritis, Gastroparesis, Hepatitis, Migraine, Thyroid disease, Renal colic, GERD/PUD, UTI. This is not an exhaustive differential.   Past Medical History / Co-morbidities: History reviewed. No pertinent past medical history.  Physical Exam: Physical exam performed. The pertinent findings include: Vital signs, no acute distress.  Afebrile.  Abdomen soft and nontender.  Lab Tests/Imaging studies: I personally interpreted labs/imaging and the pertinent results include: CBC and BMP unremarkable.  As patient has benign abdominal exam and reassuring laboratory evaluation, have low concern for acute intra-abdominal pathology requiring CT imaging at this time.  Medications: I ordered medication including IV fluids and Zofran.  I have reviewed the patients home medicines and have made adjustments as needed.  Upon reevaluation patient states that symptoms have significantly improved.  Tolerating p.o.   Disposition: After consideration of the diagnostic results and the patients response to treatment, I feel that emergency department workup does not suggest an emergent condition requiring admission or immediate intervention beyond what has been performed at this time. The plan is: Discharged home with symptomatic management of likely viral enteritis.  Nontoxic-appearing, lab work reassuring.  Improvement with Zofran, will discharge home with the same. The patient is safe for discharge and has been instructed to return immediately for worsening symptoms, change in symptoms or any other concerns.  Final Clinical Impression(s) / ED Diagnoses Final diagnoses:  Nausea and vomiting, unspecified vomiting type  Gastroenteritis    Rx / DC Orders ED Discharge Orders          Ordered    ondansetron (ZOFRAN) 4 MG tablet  Every 6 hours        07/27/22 1628            Portions of this report may have been transcribed using voice recognition software. Every effort was made to ensure accuracy; however, inadvertent computerized transcription errors may be present.    Jeanella Flattery 07/28/22 1610    Pricilla Loveless, MD 08/01/22 2215

## 2022-07-27 NOTE — Discharge Instructions (Addendum)
You were seen in the ER for nausea and vomiting.   As we discussed, your lab work was reassuring. I am sending the same nausea medicine you received in the ER to your pharmacy.   Make sure you are trying to hydrate well at home, but take it slow. Start with bland foods and work your way up as tolerated.   Continue to monitor how you're doing and return to the ER for new or worsening symptoms.
